# Patient Record
Sex: Female | Born: 1970 | Race: White | Hispanic: No | Marital: Married | Smoking: Former smoker
Health system: Southern US, Community
[De-identification: ages and names within clinical notes are randomized; demographics above are authoritative.]

## PROBLEM LIST (undated history)

## (undated) DIAGNOSIS — I10 Essential (primary) hypertension: Secondary | ICD-10-CM

---

## 2018-03-19 ENCOUNTER — Other Ambulatory Visit: Payer: Self-pay | Admitting: Orthopaedic Surgery

## 2018-03-19 DIAGNOSIS — M25512 Pain in left shoulder: Principal | ICD-10-CM

## 2018-03-19 DIAGNOSIS — G8929 Other chronic pain: Secondary | ICD-10-CM

## 2018-03-27 ENCOUNTER — Ambulatory Visit
Admission: RE | Admit: 2018-03-27 | Discharge: 2018-03-27 | Disposition: A | Discharge status: Home / Self Care | Payer: 59 | Source: Ambulatory Visit | Attending: Orthopaedic Surgery | Admitting: Orthopaedic Surgery

## 2018-03-27 DIAGNOSIS — M25512 Pain in left shoulder: Principal | ICD-10-CM

## 2018-03-27 DIAGNOSIS — G8929 Other chronic pain: Secondary | ICD-10-CM

## 2018-06-27 ENCOUNTER — Other Ambulatory Visit: Payer: Self-pay | Admitting: Orthopaedic Surgery

## 2018-06-27 ENCOUNTER — Ambulatory Visit: Admission: RE | Admit: 2018-06-27 | Payer: 59 | Source: Ambulatory Visit

## 2018-06-27 DIAGNOSIS — M25512 Pain in left shoulder: Principal | ICD-10-CM

## 2018-06-27 DIAGNOSIS — G8929 Other chronic pain: Secondary | ICD-10-CM

## 2018-07-25 ENCOUNTER — Other Ambulatory Visit: Payer: 59

## 2018-08-29 ENCOUNTER — Other Ambulatory Visit: Payer: 59

## 2018-10-06 ENCOUNTER — Other Ambulatory Visit: Payer: 59

## 2018-12-11 ENCOUNTER — Inpatient Hospital Stay: Admission: RE | Admit: 2018-12-11 | Payer: 59 | Source: Ambulatory Visit

## 2019-08-10 ENCOUNTER — Other Ambulatory Visit: Payer: Self-pay

## 2019-08-10 ENCOUNTER — Ambulatory Visit
Admission: RE | Admit: 2019-08-10 | Discharge: 2019-08-10 | Disposition: A | Discharge status: Home / Self Care | Payer: 59 | Source: Ambulatory Visit | Attending: Internal Medicine | Admitting: Internal Medicine

## 2019-08-10 ENCOUNTER — Other Ambulatory Visit: Payer: Self-pay | Admitting: Internal Medicine

## 2019-08-10 DIAGNOSIS — M170 Bilateral primary osteoarthritis of knee: Secondary | ICD-10-CM

## 2019-12-02 ENCOUNTER — Institutional Professional Consult (permissible substitution): Payer: Self-pay | Admitting: Neurology

## 2019-12-21 ENCOUNTER — Telehealth: Payer: Self-pay

## 2019-12-21 ENCOUNTER — Institutional Professional Consult (permissible substitution): Payer: 59 | Admitting: Neurology

## 2019-12-21 ENCOUNTER — Encounter: Payer: Self-pay | Admitting: Neurology

## 2019-12-21 NOTE — Telephone Encounter (Signed)
Pt no showed 12/21/2019 sleep consult with Dr. Frances Furbish.

## 2021-03-14 ENCOUNTER — Emergency Department (HOSPITAL_COMMUNITY): Payer: 59

## 2021-03-14 ENCOUNTER — Other Ambulatory Visit: Payer: Self-pay

## 2021-03-14 ENCOUNTER — Emergency Department (HOSPITAL_COMMUNITY)
Admission: EM | Admit: 2021-03-14 | Discharge: 2021-03-14 | Disposition: A | Discharge status: Home / Self Care | Payer: 59 | Attending: Emergency Medicine | Admitting: Emergency Medicine

## 2021-03-14 ENCOUNTER — Encounter (HOSPITAL_COMMUNITY): Payer: Self-pay | Admitting: Emergency Medicine

## 2021-03-14 DIAGNOSIS — I10 Essential (primary) hypertension: Secondary | ICD-10-CM | POA: Insufficient documentation

## 2021-03-14 DIAGNOSIS — R0602 Shortness of breath: Secondary | ICD-10-CM | POA: Insufficient documentation

## 2021-03-14 DIAGNOSIS — R609 Edema, unspecified: Secondary | ICD-10-CM | POA: Diagnosis not present

## 2021-03-14 DIAGNOSIS — R059 Cough, unspecified: Secondary | ICD-10-CM | POA: Diagnosis not present

## 2021-03-14 DIAGNOSIS — Z87891 Personal history of nicotine dependence: Secondary | ICD-10-CM | POA: Insufficient documentation

## 2021-03-14 DIAGNOSIS — R072 Precordial pain: Secondary | ICD-10-CM | POA: Diagnosis present

## 2021-03-14 DIAGNOSIS — Z8616 Personal history of COVID-19: Secondary | ICD-10-CM | POA: Diagnosis not present

## 2021-03-14 HISTORY — DX: Essential (primary) hypertension: I10

## 2021-03-14 LAB — I-STAT BETA HCG BLOOD, ED (MC, WL, AP ONLY): I-stat hCG, quantitative: <5 m[IU]/mL (ref <5)

## 2021-03-14 LAB — CBC
HCT: 42.3 % (ref 36.0–46.0)
Hemoglobin: 13.6 g/dL (ref 12.0–15.0)
MCH: 29.6 pg (ref 26.0–34.0)
MCHC: 32.2 g/dL (ref 30.0–36.0)
MCV: 92 fL (ref 80.0–100.0)
Platelets: 303 10*3/uL (ref 150–400)
RBC: 4.6 MIL/uL (ref 3.87–5.11)
RDW: 12.6 % (ref 11.5–15.5)
WBC: 5.6 10*3/uL (ref 4.0–10.5)
nRBC: 0 % (ref 0.0–0.2)

## 2021-03-14 LAB — TROPONIN I (HIGH SENSITIVITY)
Troponin I (High Sensitivity): 5 ng/L (ref <18)
Troponin I (High Sensitivity): 5 ng/L (ref <18)

## 2021-03-14 LAB — BASIC METABOLIC PANEL
Anion gap: 9 (ref 5–15)
BUN: 9 mg/dL (ref 6–20)
CO2: 26 mmol/L (ref 22–32)
Calcium: 8.5 mg/dL — ABNORMAL LOW (ref 8.9–10.3)
Chloride: 103 mmol/L (ref 98–111)
Creatinine, Ser: 0.76 mg/dL (ref 0.44–1.00)
GFR, Estimated: >60 mL/min (ref >60)
Glucose, Bld: 91 mg/dL (ref 70–99)
Potassium: 4 mmol/L (ref 3.5–5.1)
Sodium: 138 mmol/L (ref 135–145)

## 2021-03-14 LAB — BRAIN NATRIURETIC PEPTIDE: B Natriuretic Peptide: 19.7 pg/mL (ref 0.0–100.0)

## 2021-03-14 NOTE — ED Provider Notes (Signed)
Emergency Medicine Provider Triage Evaluation Note  Sherri Flynn , a 50 y.o. female  was evaluated in triage.  Pt complains of worsening shortness of breath and chest pain over the past several months.  This is a new problem for the patient.  She has been previously healthy with no medications she takes.  She was noted to also have some hypertension back in August.  She saw her cardiologist for the first time today for further evaluation of these problems.  She was hypertensive to 180/75.  She was also having worsening chest pain and shortness of breath.  He sent her over here to be evaluated to make sure nothing else was going on.  She denies any abdominal pain, nausea, vomiting.  She is not a smoker.  She has had some intermittent left lower extremity leg swelling over the past couple of months.  She was sick back in September she has had a chronic cough ever since.  Denies any blood in her cough.  Otherwise, she has not noticed any other symptoms.  Review of Systems  Positive: Pain, shortness of breath, cough, leg swelling Negative:   Physical Exam  BP (!) 151/85   Pulse 76   Temp 98 F (36.7 C)   Resp (!) 22   LMP 03/06/2021 (Exact Date)   SpO2 100%  Gen:   Awake, no distress   Resp:  Patient is mildly dyspneic at rest.  Lung sounds clear to auscultation bilaterally. MSK:   Moves extremities without difficulty  Other:  No pitting edema in lower extremities.  Medical Decision Making  Medically screening exam initiated at 4:04 PM.  Appropriate orders placed.  Jinx Gilden Youngren was informed that the remainder of the evaluation will be completed by another provider, this initial triage assessment does not replace that evaluation, and the importance of remaining in the ED until their evaluation is complete.  Seems like a chronic problem, will order shortness of breath labs to evaluate.   Therese Sarah 03/14/21 1606    Glendora Score, MD 03/14/21 [phone]

## 2021-03-14 NOTE — ED Provider Notes (Signed)
[facility] EMERGENCY DEPARTMENT Provider Note   CSN: [id number] Arrival date & time: 03/14/21  1534     History Chief Complaint  Patient presents with   Shortness of Breath   Chest Pain    Sherri Flynn is a 50 y.o. female.  Patient was sent to the ED today after an initial evaluation by cardiology. She has been having intermittent shortness of breath since the summer. Shortness of breath started increasing in frequency in August. She was noted to be hypertensive at a primary care visit. She was COVID positive in September had developed a persistent cough. Cough has been  associated with chest pain x 2 months. Ankle swelling associated with standing, resolves at night when feet/legs are elevated.  The history is provided by the patient and medical records. No language interpreter was used.  Shortness of Breath Severity:  Moderate Onset quality:  Gradual Duration:  4 months Timing:  Intermittent Progression:  Worsening Chronicity:  New Context: activity   Associated symptoms: chest pain and cough   Associated symptoms: no abdominal pain   Risk factors: obesity   Chest Pain Pain location:  Substernal area Pain severity:  Moderate Timing:  Intermittent Chronicity:  New Associated symptoms: cough and shortness of breath   Associated symptoms: no abdominal pain       Past Medical History:  Diagnosis Date   Hypertension     There are no problems to display for this patient.   History reviewed. No pertinent surgical history.   OB History   No obstetric history on file.     History reviewed. No pertinent family history.  Social History   Tobacco Use   Smoking status: Former    Types: Cigarettes    Quit date: 2002    Years since quitting: 20.8   Smokeless tobacco: Never  Substance Use Topics   Alcohol use: Not Currently   Drug use: Never    Home Medications Prior to Admission medications   Not on File    Allergies     Penicillins  Review of Systems   Review of Systems  Respiratory:  Positive for cough and shortness of breath.   Cardiovascular:  Positive for chest pain.  Gastrointestinal:  Negative for abdominal pain.  All other systems reviewed and are negative.  Physical Exam Updated Vital Signs BP (!) 151/85   Pulse 76   Temp 98 F (36.7 C)   Resp (!) 22   LMP 03/06/2021 (Exact Date)   SpO2 100%   Physical Exam Constitutional:      General: She is not in acute distress.    Appearance: She is well-developed. She is obese.  HENT:     Head: Normocephalic.  Eyes:     Conjunctiva/sclera: Conjunctivae normal.  Cardiovascular:     Rate and Rhythm: Normal rate.  Pulmonary:     Effort: Pulmonary effort is normal.     Breath sounds: Normal breath sounds.  Abdominal:     Palpations: Abdomen is soft.  Musculoskeletal:     Right lower leg: Edema present.     Left lower leg: Edema present.     Comments: Trace ankle edema.  Skin:    General: Skin is warm and dry.  Neurological:     Mental Status: She is alert and oriented to person, place, and time.  Psychiatric:        Mood and Affect: Mood normal.        Behavior: Behavior normal.  ED Results / Procedures / Treatments   Labs (all labs ordered are listed, but only abnormal results are displayed) Labs Reviewed  BASIC METABOLIC PANEL - Abnormal; Notable for the following components:      Result Value   Calcium 8.5 (*)    All other components within normal limits  CBC  BRAIN NATRIURETIC PEPTIDE  I-STAT BETA HCG BLOOD, ED ([facility], [facility], [facility] ONLY)  TROPONIN I (HIGH SENSITIVITY)  TROPONIN I (HIGH SENSITIVITY)    EKG EKG Interpretation  Date/Time:  Wednesday March 14 2021 15:46:21 EST Ventricular Rate:  79 PR Interval:  128 QRS Duration: 84 QT Interval:  378 QTC Calculation: 433 R Axis:   45 Text Interpretation: Normal sinus rhythm Cannot rule out Anterior infarct , age undetermined Abnormal ECG No old tracing to compare  Confirmed by Melene Plan [phone]) on 03/14/2021 5:02:51 PM  Radiology DG Chest 2 View  Result Date: 03/14/2021 CLINICAL DATA:  Shortness of breath, chest pain EXAM: CHEST - 2 VIEW COMPARISON:  None. FINDINGS: The heart size and mediastinal contours are within normal limits. Both lungs are clear. The visualized skeletal structures are unremarkable. IMPRESSION: No active cardiopulmonary disease. Electronically Signed   By: Elige Ko M.D.   On: 03/14/2021 16:22    Procedures Procedures   Medications Ordered in ED Medications - No data to display  ED Course  I have reviewed the triage vital signs and the nursing notes.  Pertinent labs & imaging results that were available during my care of the patient were reviewed by me and considered in my medical decision making (see chart for details).    MDM Rules/Calculators/A&P                           Patient presents with chronic shortness of breath. Presentation not consistent with acute cardiac etiology (non-ischemic ECG, negative troponin), CHF. Not consistent with acute respiratory etiology including PNA, asthma, COPD. Mild hypertension without indication of end-organ damage. Patient is already established with cardiology who has provided recommendations for antihypertensive medication and outpatient echocardiogram. Patient appears safe for discharge at this time. Care instructions and return precautions provided. Final Clinical Impression(s) / ED Diagnoses Final diagnoses:  Shortness of breath  Hypertension, unspecified type    Rx / DC Orders ED Discharge Orders     None        Felicie Morn, NP 03/14/21 2130    Melene Plan, DO 03/14/21 2341

## 2021-03-14 NOTE — ED Triage Notes (Signed)
Pt sent here by cardiologist for shob and cp x1 month. Pt reports exertional shob that's been getting worse, and c/o constant dull midsternal cp. Pt had covid in September, and has had a cough since.

## 2021-03-14 NOTE — Discharge Instructions (Addendum)
Your lab work, ECG, and chest xray are reassuring. Continue with the treatment and diagnostic plan initiated by your cardiologist today.

## 2021-03-14 NOTE — ED Notes (Signed)
Pt verbalized understanding of d/c instructions, meds, and followup care. Denies questions. VSS, no distress noted. Steady gait to exit with all belongings.  ?

## 2022-12-26 IMAGING — CR DG CHEST 2V
2 series · 2 of 2 positions shown · non-contrast
Comparison: None.

CLINICAL DATA: Shortness of breath, chest pain

EXAM:
CHEST - 2 VIEW

[chest pa]
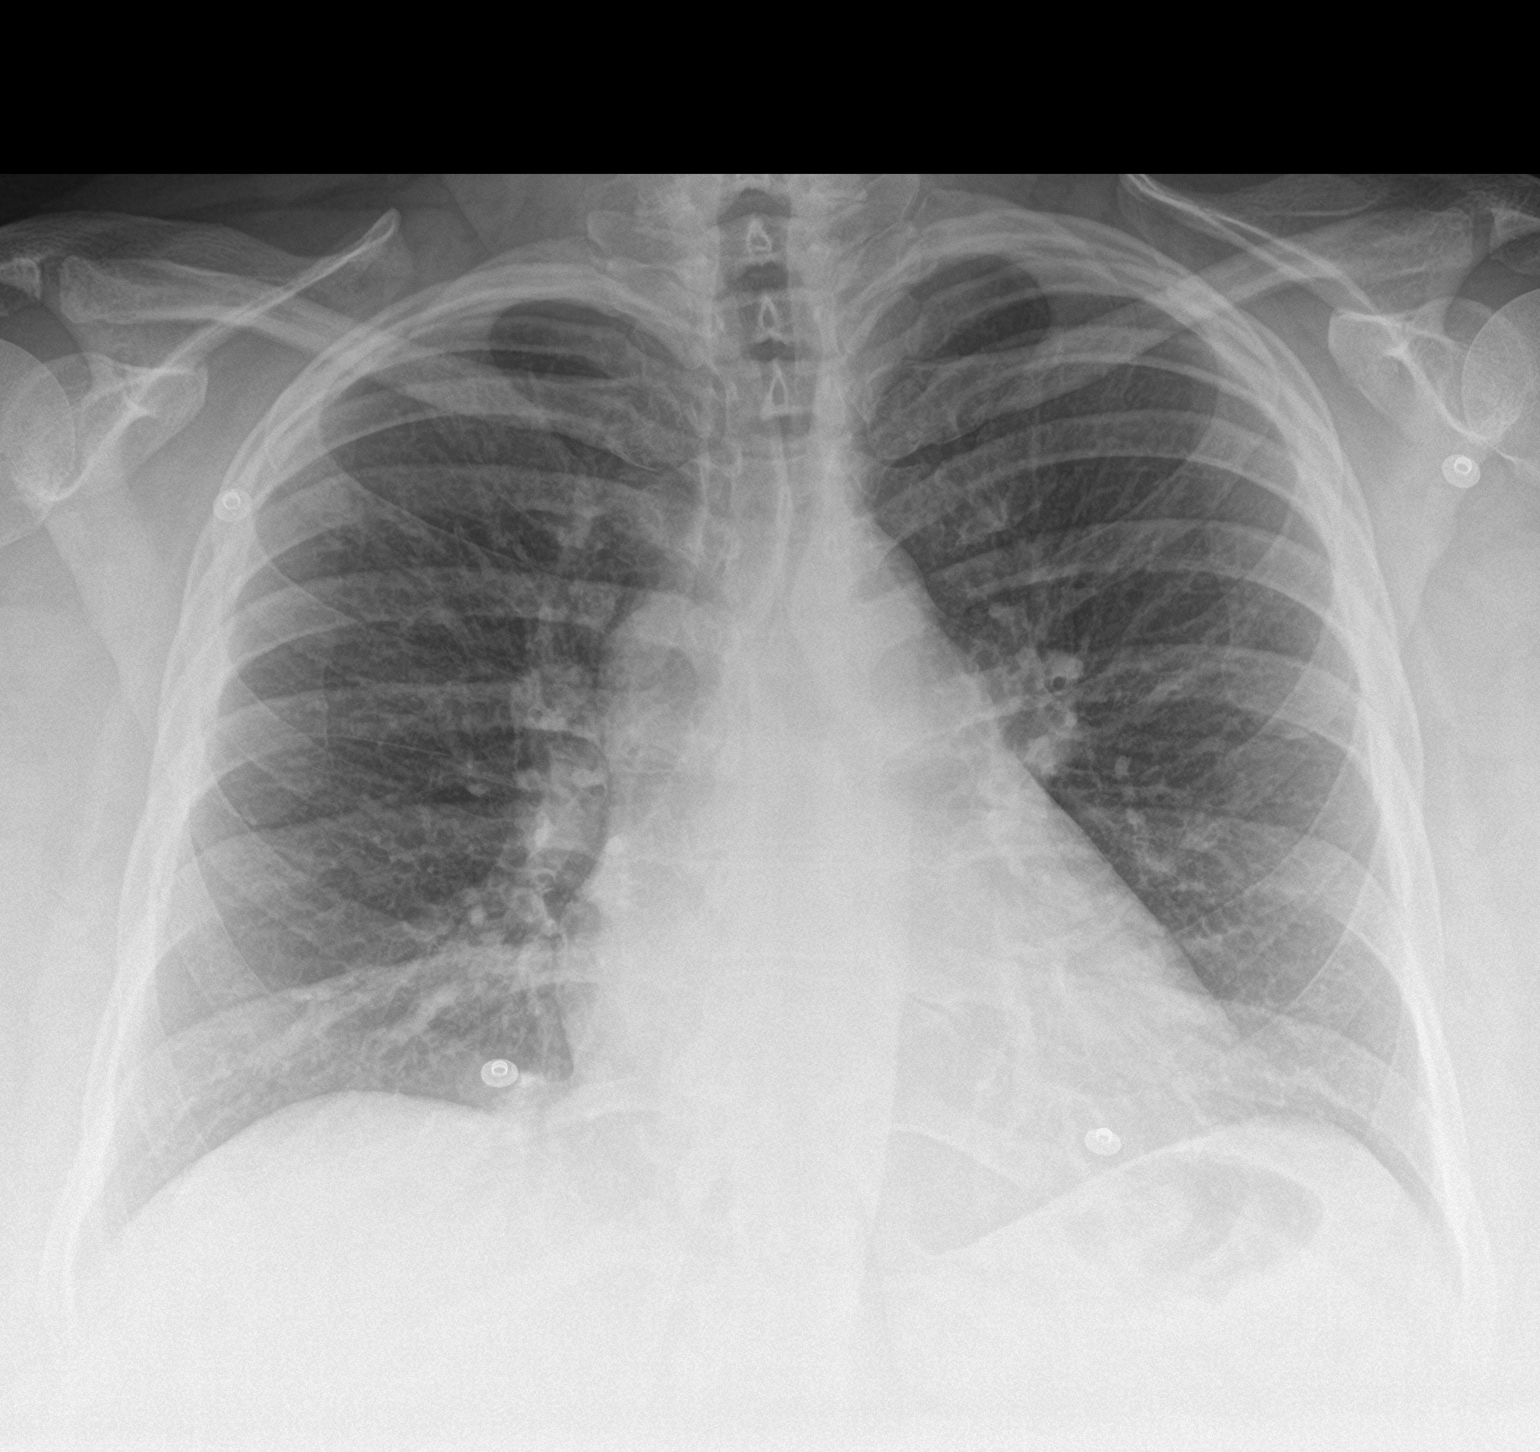

[chest lat]
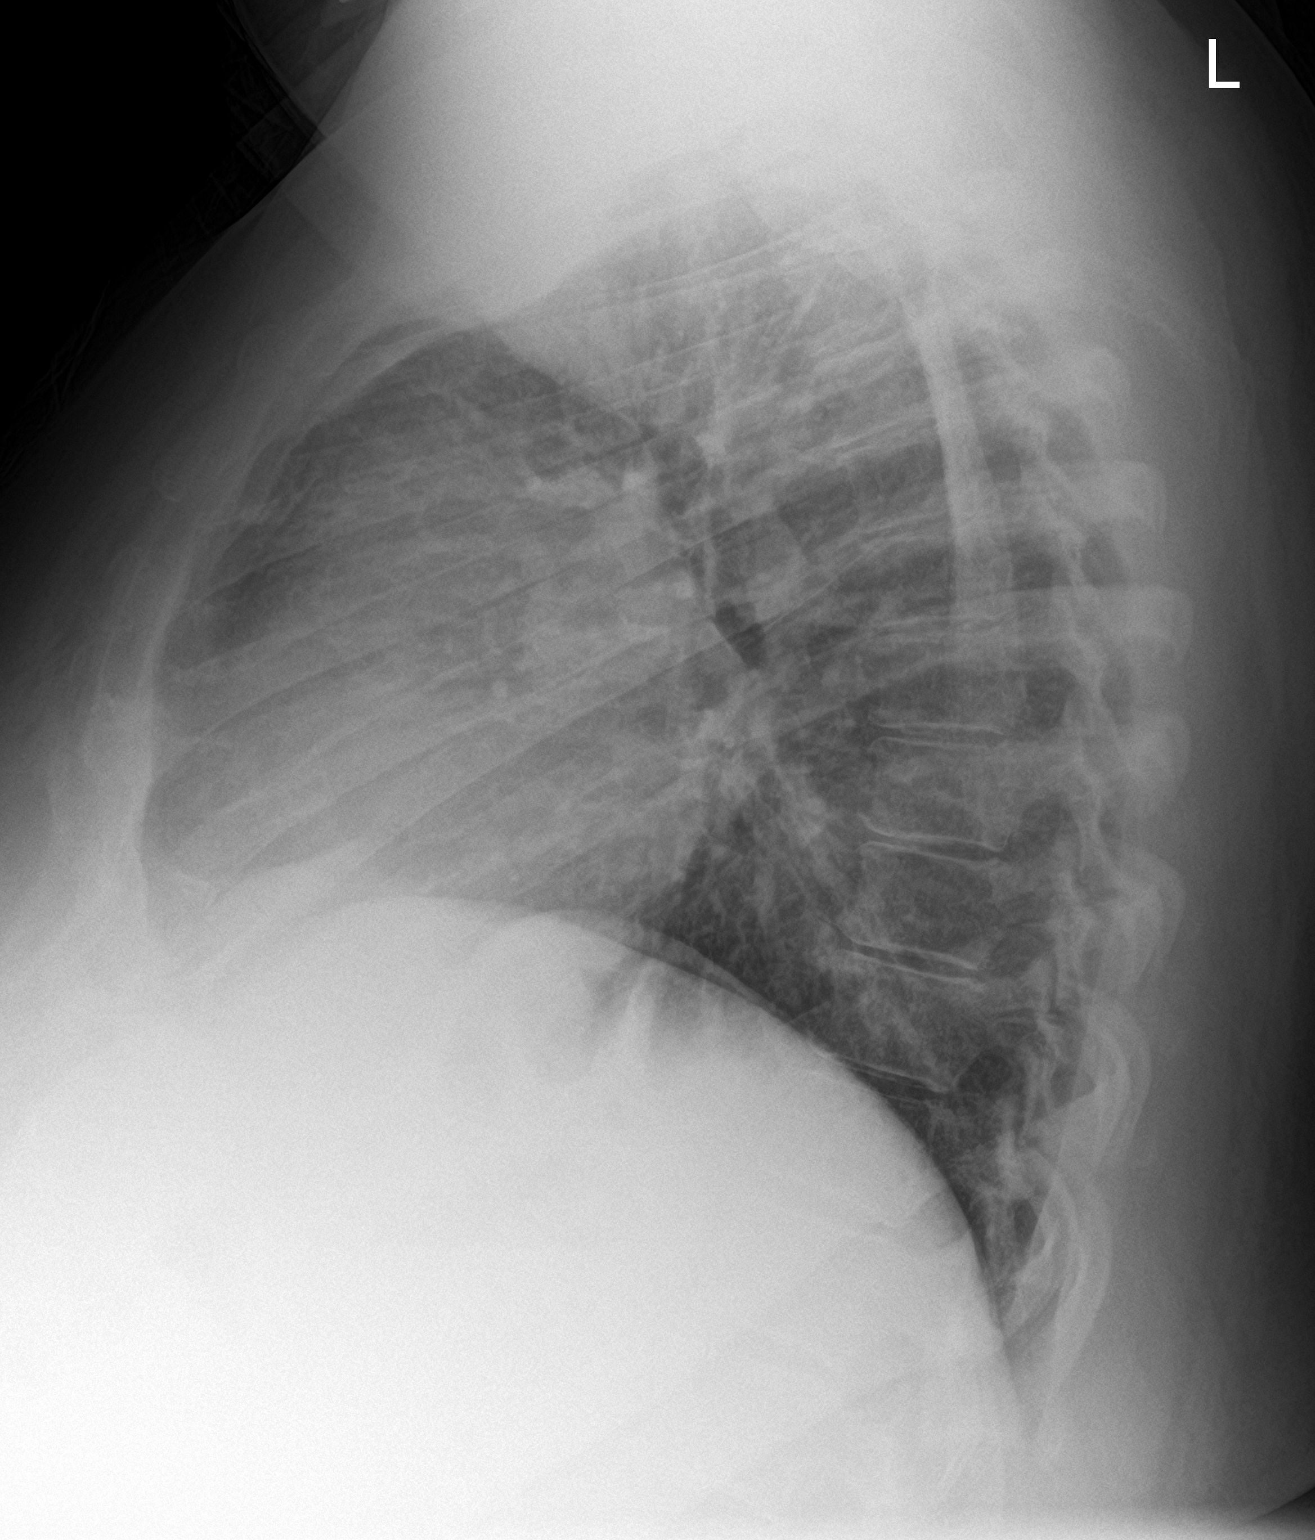

[2 of 2 positions shown; findings below may reference images not displayed]

FINDINGS: The heart size and mediastinal contours are within normal limits.
Both lungs are clear. The visualized skeletal structures are
unremarkable.
IMPRESSION: No active cardiopulmonary disease.
